# Patient Record
Sex: Male | Born: 1950 | Race: White | Hispanic: No | Marital: Married
Health system: Southern US, Community
[De-identification: ages and names within clinical notes are randomized; demographics above are authoritative.]

---

## 2016-11-07 ENCOUNTER — Other Ambulatory Visit: Payer: Self-pay | Admitting: Emergency Medicine

## 2016-11-07 ENCOUNTER — Ambulatory Visit (INDEPENDENT_AMBULATORY_CARE_PROVIDER_SITE_OTHER): Payer: Worker's Compensation | Admitting: Sports Medicine

## 2016-11-07 ENCOUNTER — Ambulatory Visit (INDEPENDENT_AMBULATORY_CARE_PROVIDER_SITE_OTHER): Payer: Worker's Compensation

## 2016-11-07 ENCOUNTER — Ambulatory Visit (INDEPENDENT_AMBULATORY_CARE_PROVIDER_SITE_OTHER): Payer: Self-pay

## 2016-11-07 DIAGNOSIS — S62326A Displaced fracture of shaft of fifth metacarpal bone, right hand, initial encounter for closed fracture: Secondary | ICD-10-CM | POA: Diagnosis not present

## 2016-11-07 DIAGNOSIS — W230XXA Caught, crushed, jammed, or pinched between moving objects, initial encounter: Secondary | ICD-10-CM

## 2016-11-07 DIAGNOSIS — S62396A Other fracture of fifth metacarpal bone, right hand, initial encounter for closed fracture: Secondary | ICD-10-CM

## 2016-11-07 DIAGNOSIS — R52 Pain, unspecified: Secondary | ICD-10-CM

## 2016-11-07 DIAGNOSIS — W230XXD Caught, crushed, jammed, or pinched between moving objects, subsequent encounter: Secondary | ICD-10-CM | POA: Diagnosis not present

## 2016-11-07 DIAGNOSIS — S62396D Other fracture of fifth metacarpal bone, right hand, subsequent encounter for fracture with routine healing: Secondary | ICD-10-CM | POA: Diagnosis not present

## 2016-11-07 MED ORDER — HYDROCODONE-ACETAMINOPHEN 5-325 MG PO TABS: 1.0000 | ORAL_TABLET | Freq: Three times a day (TID) | ORAL | 0 refills | Status: DC | PRN

## 2016-11-07 NOTE — Assessment & Plan Note (Signed)
Fifth metacarpal fracture angulated at 55 post closed reduction now. Ulnar gutter splint placed, hydrocodone for pain. Patient will be sent downstairs for postreduction films, and return to see me in one week, x-ray before visit.  I billed a fracture code for this encounter, all subsequent visits will be post-op checks in the global period.

## 2016-11-07 NOTE — Progress Notes (Signed)
   Subjective:    I'm seeing this patient as a consultation for:  Dr. Lajean Manesavid Massey  CC: Right hand fracture  HPI: This is a pleasant 65 year old male, 4 days ago at work he had a door opened onto his hand, had immediate pain, swelling, bruising, deformity. He was seen by Dr. Georgina PillionMassey and referred to me for further evaluation and definitive treatment. Pain is localized without radiation, x-rays showed an angulated fifth metacarpal fracture.  Past medical history:  Negative.  See flowsheet/record as well for more information.  Surgical history: Negative.  See flowsheet/record as well for more information.  Family history: Negative.  See flowsheet/record as well for more information.  Social history: Negative.  See flowsheet/record as well for more information.  Allergies, and medications have been entered into the medical record, reviewed, and no changes needed.   Review of Systems: No headache, visual changes, nausea, vomiting, diarrhea, constipation, dizziness, abdominal pain, skin rash, fevers, chills, night sweats, weight loss, swollen lymph nodes, body aches, joint swelling, muscle aches, chest pain, shortness of breath, mood changes, visual or auditory hallucinations.   Objective:   General: Well Developed, well nourished, and in no acute distress.  Neuro/Psych: Alert and oriented x3, extra-ocular muscles intact, able to move all 4 extremities, sensation grossly intact. Skin: Warm and dry, no rashes noted.  Respiratory: Not using accessory muscles, speaking in full sentences, trachea midline.  Cardiovascular: Pulses palpable, no extremity edema. Abdomen: Does not appear distended. Right hand: Visibly swollen, deformity of the fifth metacarpal, minimal tenderness.  Initial x-rays personally reviewed and show a apex dorsal fracture of the neck of the fifth metacarpal angulated at 55.  Procedure:  Fracture Reduction   Risks, benefits, and alternatives explained and consent  obtained. Time out conducted. Surface prepped with alcohol. 5cc 3 mL lidocaine, 3 mL Marcaine lidocaine infiltrated in a hematoma block. Adequate anesthesia ensured. Fracture reduction: I applied and axial force on the fifth digit, and then applied a volarly directed force over the top of the fracture reducing it Post reduction films obtained showed anatomic/near-anatomic alignment. Pt stable, aftercare and follow-up advised.  Ulnar gutter splint applied.  Impression and Recommendations:   This case required medical decision making of moderate complexity.  Closed disp fracture of shaft of fifth metacarpal bone of right hand Fifth metacarpal fracture angulated at 55 post closed reduction now. Ulnar gutter splint placed, hydrocodone for pain. Patient will be sent downstairs for postreduction films, and return to see me in one week, x-ray before visit.  I billed a fracture code for this encounter, all subsequent visits will be post-op checks in the global period.

## 2016-11-11 ENCOUNTER — Ambulatory Visit (INDEPENDENT_AMBULATORY_CARE_PROVIDER_SITE_OTHER): Payer: Worker's Compensation

## 2016-11-11 ENCOUNTER — Encounter: Payer: Self-pay | Admitting: Sports Medicine

## 2016-11-11 ENCOUNTER — Ambulatory Visit (INDEPENDENT_AMBULATORY_CARE_PROVIDER_SITE_OTHER): Payer: Worker's Compensation | Admitting: Sports Medicine

## 2016-11-11 ENCOUNTER — Other Ambulatory Visit: Payer: Self-pay | Admitting: Sports Medicine

## 2016-11-11 VITALS — BP 157/92 | HR 81 | Resp 18 | Wt 171.0 lb

## 2016-11-11 DIAGNOSIS — S62396D Other fracture of fifth metacarpal bone, right hand, subsequent encounter for fracture with routine healing: Secondary | ICD-10-CM

## 2016-11-11 DIAGNOSIS — W230XXD Caught, crushed, jammed, or pinched between moving objects, subsequent encounter: Secondary | ICD-10-CM

## 2016-11-11 DIAGNOSIS — S62326D Displaced fracture of shaft of fifth metacarpal bone, right hand, subsequent encounter for fracture with routine healing: Secondary | ICD-10-CM

## 2016-11-11 DIAGNOSIS — S62326A Displaced fracture of shaft of fifth metacarpal bone, right hand, initial encounter for closed fracture: Secondary | ICD-10-CM

## 2016-11-11 MED ORDER — HYDROCODONE-ACETAMINOPHEN 10-325 MG PO TABS: 1.0000 | ORAL_TABLET | Freq: Three times a day (TID) | ORAL | 0 refills | Status: DC | PRN

## 2016-11-11 NOTE — Assessment & Plan Note (Signed)
Slight loss of reduction post closed reduction of a fifth metacarpal fracture. Still within tolerable limits. Increasing pain medication dose, continue splint for now. No changes in work restrictions. Return in 2 weeks.

## 2016-11-11 NOTE — Progress Notes (Signed)
  Subjective: 4 days post closed reduction of a right boxer's fracture. Overall doing okay, felt as though it was somewhat painful and a bit cold, but this all resolved by the time he came in.   Objective: General: Well-developed, well-nourished, and in no acute distress. Right hand: Swollen and bruised as expected, good capillary refill, neurovascularly intact distally. Only minimal tenderness at the fracture.  X-rays reviewed, there has been a slight loss of abduction but is still within acceptable limits. Angulation on the lateral view is approximately 30.  Assessment/plan:   Closed disp fracture of shaft of fifth metacarpal bone of right hand Slight loss of reduction post closed reduction of a fifth metacarpal fracture. Still within tolerable limits. Increasing pain medication dose, continue splint for now. No changes in work restrictions. Return in 2 weeks.

## 2016-11-14 ENCOUNTER — Ambulatory Visit: Payer: Self-pay | Admitting: Sports Medicine

## 2016-11-17 ENCOUNTER — Ambulatory Visit (INDEPENDENT_AMBULATORY_CARE_PROVIDER_SITE_OTHER): Payer: Self-pay | Admitting: Sports Medicine

## 2016-11-17 ENCOUNTER — Ambulatory Visit (INDEPENDENT_AMBULATORY_CARE_PROVIDER_SITE_OTHER): Payer: Worker's Compensation

## 2016-11-17 DIAGNOSIS — W230XXD Caught, crushed, jammed, or pinched between moving objects, subsequent encounter: Secondary | ICD-10-CM | POA: Diagnosis not present

## 2016-11-17 DIAGNOSIS — S62326D Displaced fracture of shaft of fifth metacarpal bone, right hand, subsequent encounter for fracture with routine healing: Secondary | ICD-10-CM

## 2016-11-17 DIAGNOSIS — S62396D Other fracture of fifth metacarpal bone, right hand, subsequent encounter for fracture with routine healing: Secondary | ICD-10-CM | POA: Diagnosis not present

## 2016-11-17 NOTE — Assessment & Plan Note (Signed)
Patient returned early, he has not been wearing his splint. Overall doing okay, repeat x-rays, needs to wear splints, return to see me in 2 weeks.

## 2016-11-17 NOTE — Progress Notes (Signed)
  Subjective: Albert Kim is just about one week post closed reduction of the fifth metacarpal boxer's fracture. He has not been wearing a splint, in fact he didn't even bring it today. He is on light duty with left-handed work only.  Objective: General: Well-developed, well-nourished, and in no acute distress. Right hand: Swollen, bruised as expected, minimally tender over the fracture, neurovascularly intact distally, no skin breakdown.  Assessment/plan:   Closed disp fracture of shaft of fifth metacarpal bone of right hand Patient returned early, he has not been wearing his splint. Overall doing okay, repeat x-rays, needs to wear splints, return to see me in 2 weeks.

## 2016-11-25 ENCOUNTER — Ambulatory Visit: Payer: Self-pay | Admitting: Sports Medicine

## 2016-12-01 ENCOUNTER — Ambulatory Visit (INDEPENDENT_AMBULATORY_CARE_PROVIDER_SITE_OTHER): Payer: Worker's Compensation | Admitting: Sports Medicine

## 2016-12-01 DIAGNOSIS — S62326D Displaced fracture of shaft of fifth metacarpal bone, right hand, subsequent encounter for fracture with routine healing: Secondary | ICD-10-CM

## 2016-12-01 NOTE — Progress Notes (Signed)
  Subjective: 3 weeks post fifth metacarpal boxer's fracture, doing well, pain-free.   Objective: General: Well-developed, well-nourished, and in no acute distress. Right hand: No longer tender to palpation over the fracture, good motion, good strength.  Assessment/plan:   Closed disp fracture of shaft of fifth metacarpal bone of right hand 3 weeks post fracture and doing well, may start light work with the right hand, no greater than 5 pounds lifting. Return to see me in 3 weeks, I will probably be clearing him after that.

## 2016-12-01 NOTE — Assessment & Plan Note (Signed)
3 weeks post fracture and doing well, may start light work with the right hand, no greater than 5 pounds lifting. Return to see me in 3 weeks, I will probably be clearing him after that.

## 2016-12-22 ENCOUNTER — Ambulatory Visit: Payer: Self-pay | Admitting: Sports Medicine

## 2016-12-23 ENCOUNTER — Ambulatory Visit (INDEPENDENT_AMBULATORY_CARE_PROVIDER_SITE_OTHER): Payer: Worker's Compensation | Admitting: Sports Medicine

## 2016-12-23 ENCOUNTER — Encounter: Payer: Self-pay | Admitting: Sports Medicine

## 2016-12-23 DIAGNOSIS — M19041 Primary osteoarthritis, right hand: Secondary | ICD-10-CM | POA: Diagnosis not present

## 2016-12-23 MED ORDER — MELOXICAM 15 MG PO TABS: ORAL_TABLET | ORAL | 3 refills | Status: AC

## 2016-12-23 NOTE — Assessment & Plan Note (Signed)
Pain at the second MCP as well as the trapeziometacarpal joint. These 2 structures were injected today, they are arthritic on ultrasound. Adding meloxicam. Return to see me in one month to evaluate response.

## 2016-12-23 NOTE — Progress Notes (Signed)
   Subjective:    I'm seeing this patient as a consultation for:  Albert SchillingElizabeth Duffy, NP  CC: right hand pain  HPI: I have been treating this pleasant 65 -year-oldmale for a boxer's fracture of the right hand, this is resolved, he is left with a bit of residual angulation but a functional hand, angulation is within tolerable limits. More recently he's been complaining of pain that he localizes at the second MCP as well as the trapeziometacarpal joint, moderate, persistent, doesn't respond to over-the-counter analgesics and NSAIDs. His main complaint is stiffness.  Past medical history:  Negative.  See flowsheet/record as well for more information.  Surgical history: Negative.  See flowsheet/record as well for more information.  Family history: Negative.  See flowsheet/record as well for more information.  Social history: Negative.  See flowsheet/record as well for more information.  Allergies, and medications have been entered into the medical record, reviewed, and no changes needed.   Review of Systems: No headache, visual changes, nausea, vomiting, diarrhea, constipation, dizziness, abdominal pain, skin rash, fevers, chills, night sweats, weight loss, swollen lymph nodes, body aches, joint swelling, muscle aches, chest pain, shortness of breath, mood changes, visual or auditory hallucinations.   Objective:   General: Well Developed, well nourished, and in no acute distress.  Neuro/Psych: Alert and oriented x3, extra-ocular muscles intact, able to move all 4 extremities, sensation grossly intact. Skin: Warm and dry, no rashes noted.  Respiratory: Not using accessory muscles, speaking in full sentences, trachea midline.  Cardiovascular: Pulses palpable, no extremity edema. Abdomen: Does not appear distended. Right hand: Tender to palpation with swelling at the second MCP as well as the trapeziometacarpal joint with a classic squared off appearance that is seen and osteoarthritis.  Procedure:  Real-time Ultrasound Guided Injection of right second MCP Device: GE Logiq E  Verbal informed consent obtained.  Time-out conducted.  Noted no overlying erythema, induration, or other signs of local infection.  Skin prepped in a sterile fashion.  Local anesthesia: Topical Ethyl chloride.  With sterile technique and under real time ultrasound guidance:  1/2 mL kenalog 40, 1/2 mL lidocaine injected easily Completed without difficulty  Pain immediately resolved suggesting accurate placement of the medication.  Advised to call if fevers/chills, erythema, induration, drainage, or persistent bleeding.  Images permanently stored and available for review in the ultrasound unit.  Impression: Technically successful ultrasound guided injection.  Procedure: Real-time Ultrasound Guided Injection of right trapeziometacarpal joint Device: GE Logiq E  Verbal informed consent obtained.  Time-out conducted.  Noted no overlying erythema, induration, or other signs of local infection.  Skin prepped in a sterile fashion.  Local anesthesia: Topical Ethyl chloride.  With sterile technique and under real time ultrasound guidance:  1/2 mL kenalog 40, 1/2 mL lidocaine injected easily Completed without difficulty  Pain immediately resolved suggesting accurate placement of the medication.  Advised to call if fevers/chills, erythema, induration, drainage, or persistent bleeding.  Images permanently stored and available for review in the ultrasound unit.  Impression: Technically successful ultrasound guided injection.  Impression and Recommendations:   This case required medical decision making of moderate complexity.  Primary osteoarthritis, right hand Pain at the second MCP as well as the trapeziometacarpal joint. These 2 structures were injected today, they are arthritic on ultrasound. Adding meloxicam. Return to see me in one month to evaluate response.

## 2017-01-23 ENCOUNTER — Ambulatory Visit: Payer: Self-pay | Admitting: Sports Medicine

## 2017-01-30 ENCOUNTER — Ambulatory Visit (INDEPENDENT_AMBULATORY_CARE_PROVIDER_SITE_OTHER): Payer: Worker's Compensation | Admitting: Sports Medicine

## 2017-01-30 DIAGNOSIS — M19041 Primary osteoarthritis, right hand: Secondary | ICD-10-CM

## 2017-01-30 NOTE — Progress Notes (Signed)
  Subjective:    CC: Follow-up  HPI: Right hand osteoarthritis: I performed a CMC as well as second MCP injection at the last visit, he returns pain-free in these 2 locations. He does complain of a bit of numbness and tingling in the first and second fingers that is fairly constant and he tells me he only felt that after his fracture. It really doesn't bother him that much, it doesn't affect his activities of daily living or his work, and he agrees not to have this worked up any further.  Past medical history:  Negative.  See flowsheet/record as well for more information.  Surgical history: Negative.  See flowsheet/record as well for more information.  Family history: Negative.  See flowsheet/record as well for more information.  Social history: Negative.  See flowsheet/record as well for more information.  Allergies, and medications have been entered into the medical record, reviewed, and no changes needed.   Review of Systems: No fevers, chills, night sweats, weight loss, chest pain, or shortness of breath.   Objective:    General: Well Developed, well nourished, and in no acute distress.  Neuro: Alert and oriented x3, extra-ocular muscles intact, sensation grossly intact.  HEENT: Normocephalic, atraumatic, pupils equal round reactive to light, neck supple, no masses, no lymphadenopathy, thyroid nonpalpable.  Skin: Warm and dry, no rashes. Cardiac: Regular rate and rhythm, no murmurs rubs or gallops, no lower extremity edema.  Respiratory: Clear to auscultation bilaterally. Not using accessory muscles, speaking in full sentences.  Impression and Recommendations:    Primary osteoarthritis, right hand Has done extremely well after right trapeziometacarpal and right second metacarpophalangeal joint injections about a month and a half ago. Return to see me as needed.  He does have a bit of tingling in the first and second fingers but this really doesn't bother him much and it doesn't  affect his work.

## 2017-01-30 NOTE — Assessment & Plan Note (Signed)
Has done extremely well after right trapeziometacarpal and right second metacarpophalangeal joint injections about a month and a half ago. Return to see me as needed.  He does have a bit of tingling in the first and second fingers but this really doesn't bother him much and it doesn't affect his work.

## 2017-11-11 IMAGING — DX DG HAND COMPLETE 3+V*R*
3 series · 3 of 3 positions shown · non-contrast
Comparison: 11/07/2016

CLINICAL DATA: Status post reduction

EXAM:
RIGHT HAND - COMPLETE 3+ VIEW

[hand pa]
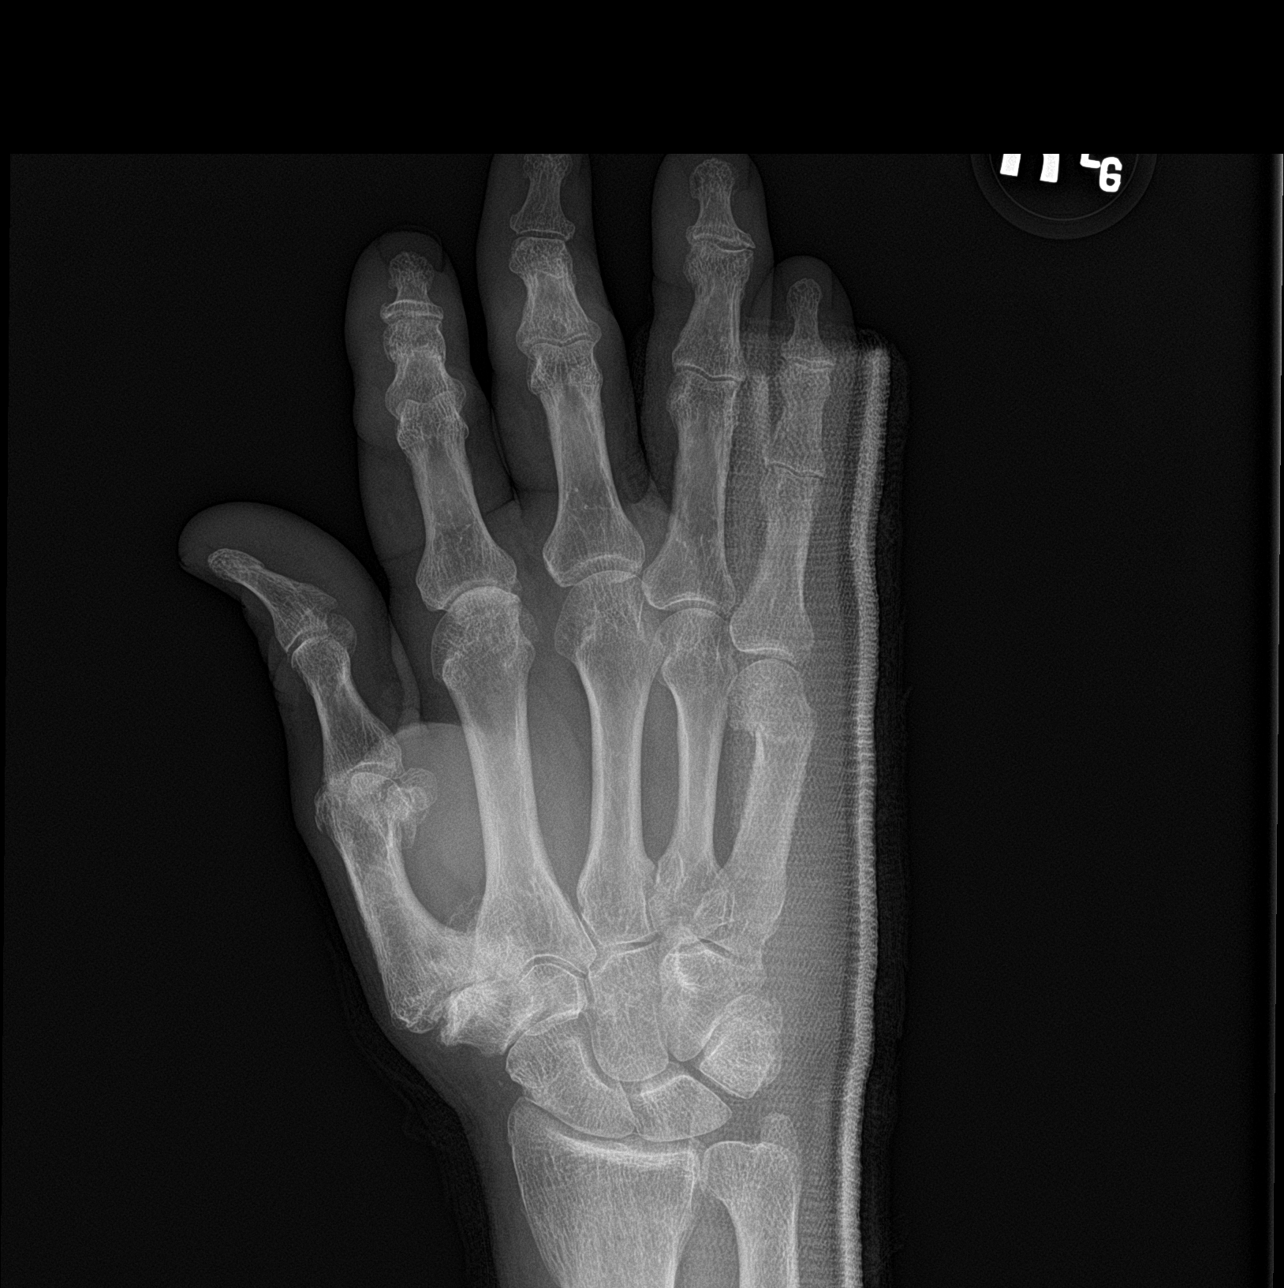

[hand obl]
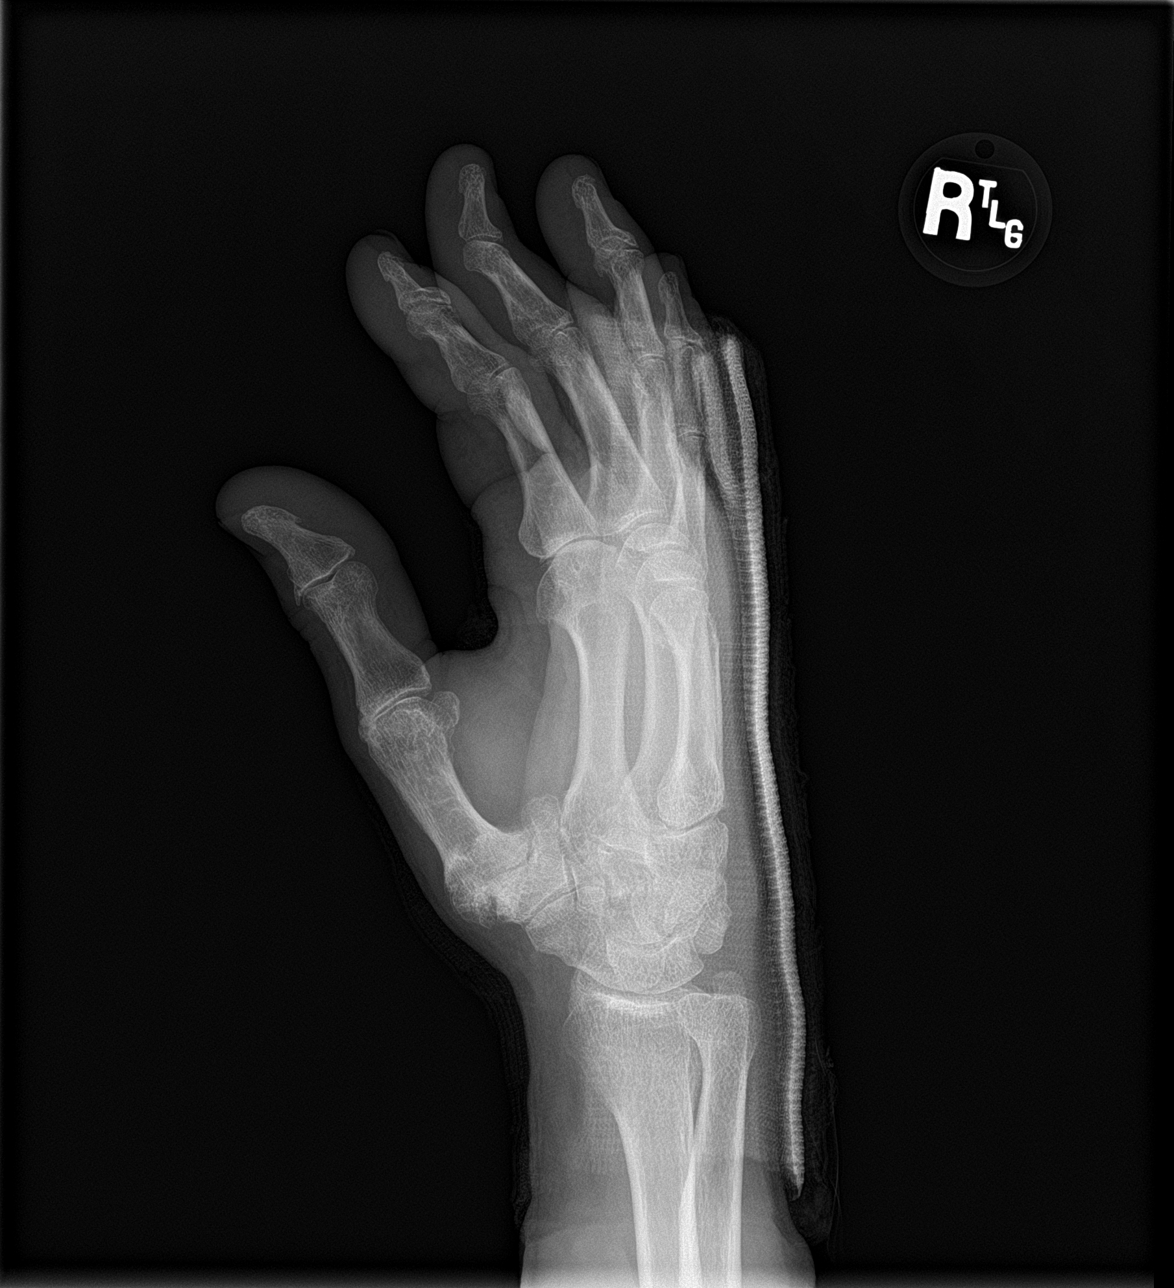

[hand lat]
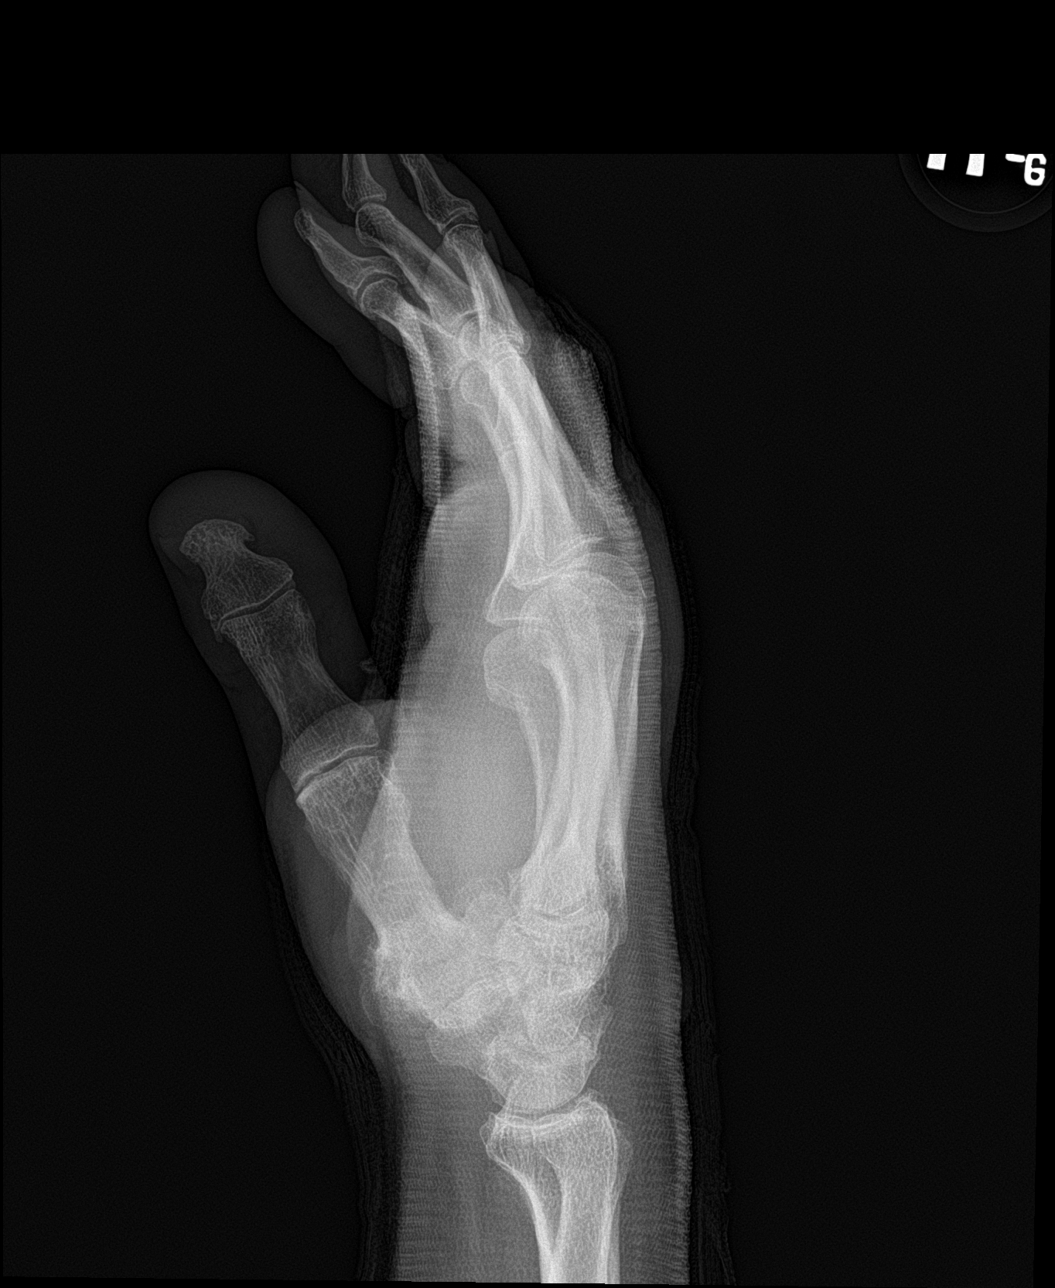

[3 of 3 positions shown; findings below may reference images not displayed]

FINDINGS: Previously seen fifth metacarpal fracture is again identified.
Slight angulation is again noted stable from the prior exam. No new
fracture or dislocation is seen.
IMPRESSION: Fifth metacarpal fracture with angulation relatively stable from the
prior study.

## 2017-11-15 IMAGING — DX DG HAND COMPLETE 3+V*R*
3 series · 3 of 3 positions shown · non-contrast
Comparison: Radiographs November 07, 2016.

CLINICAL DATA: Fifth metacarpal fracture.

EXAM:
RIGHT HAND - COMPLETE 3+ VIEW

[hand pa]
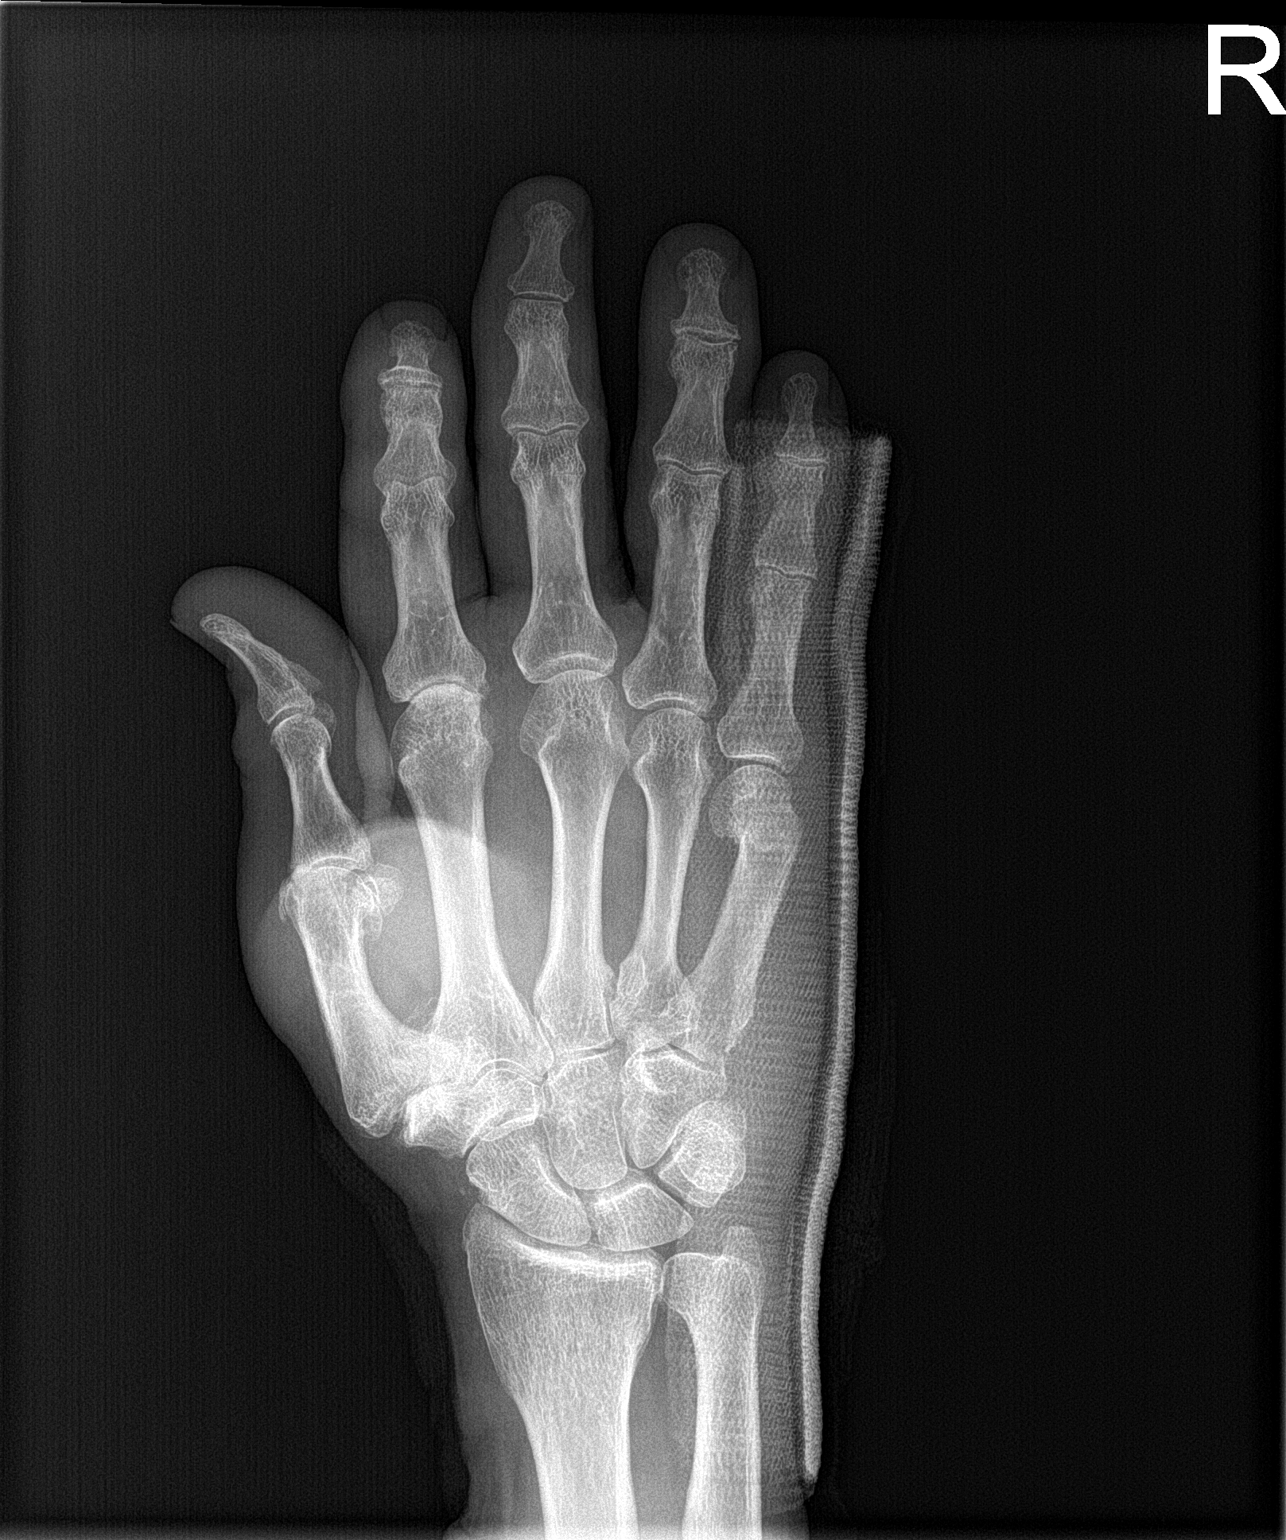

[hand obl]
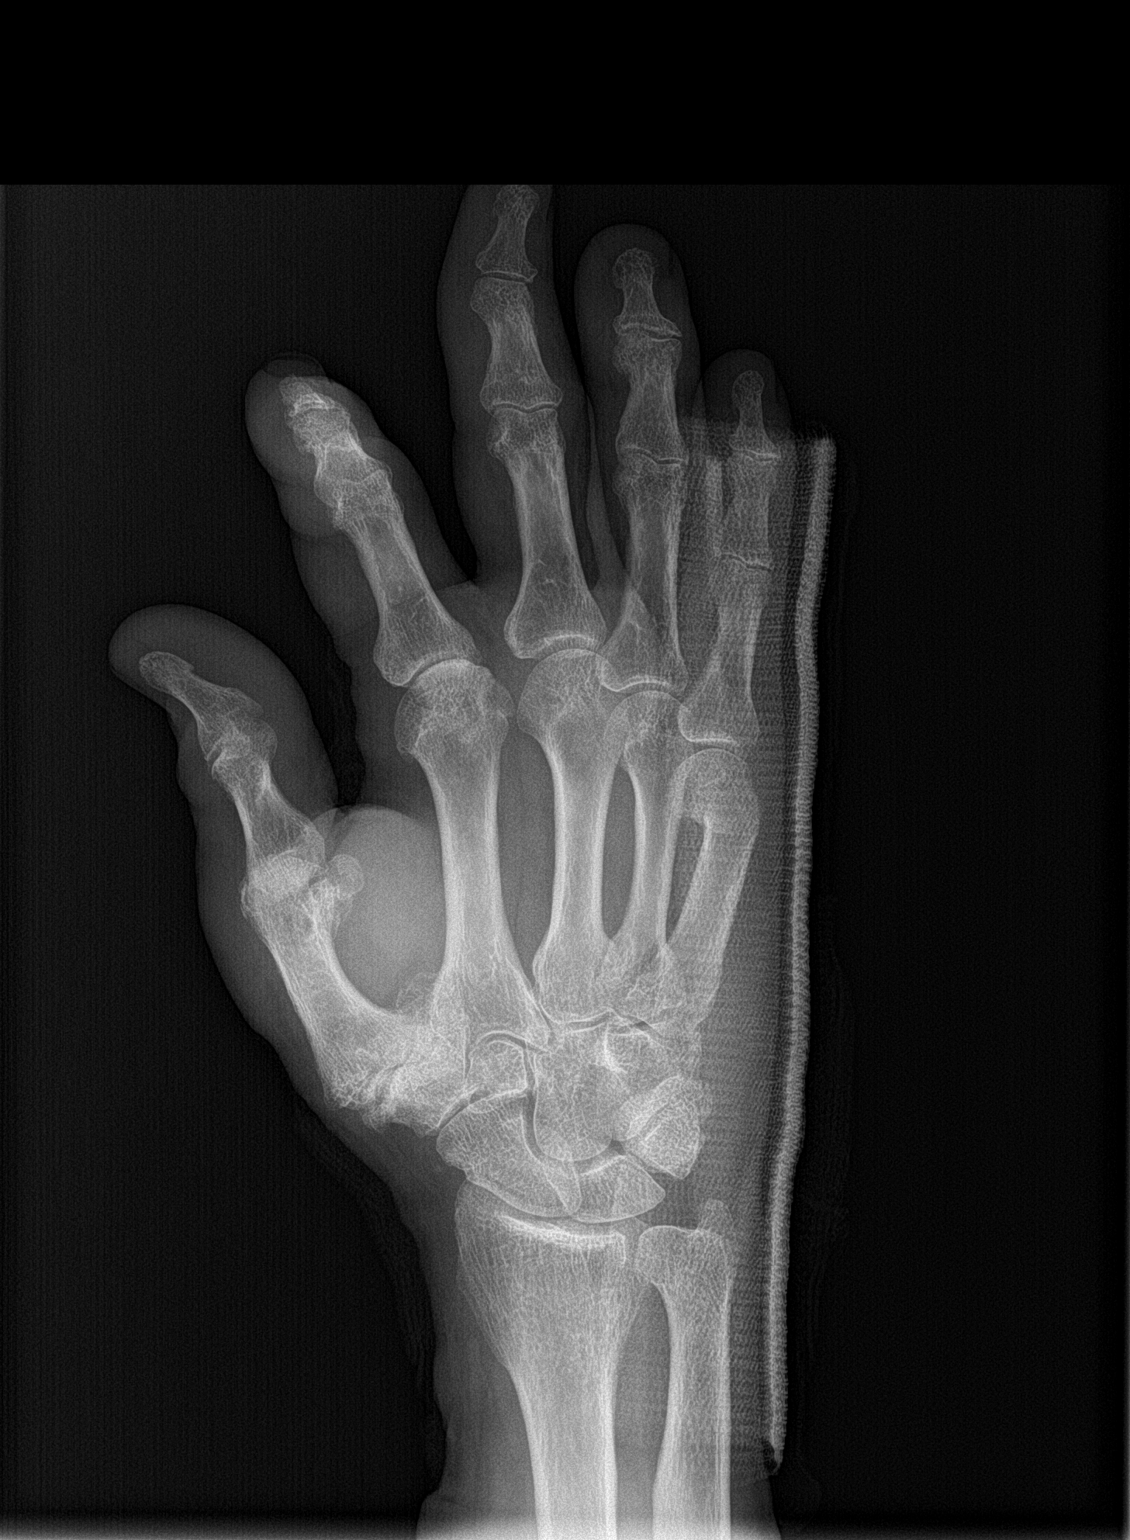

[hand lat]
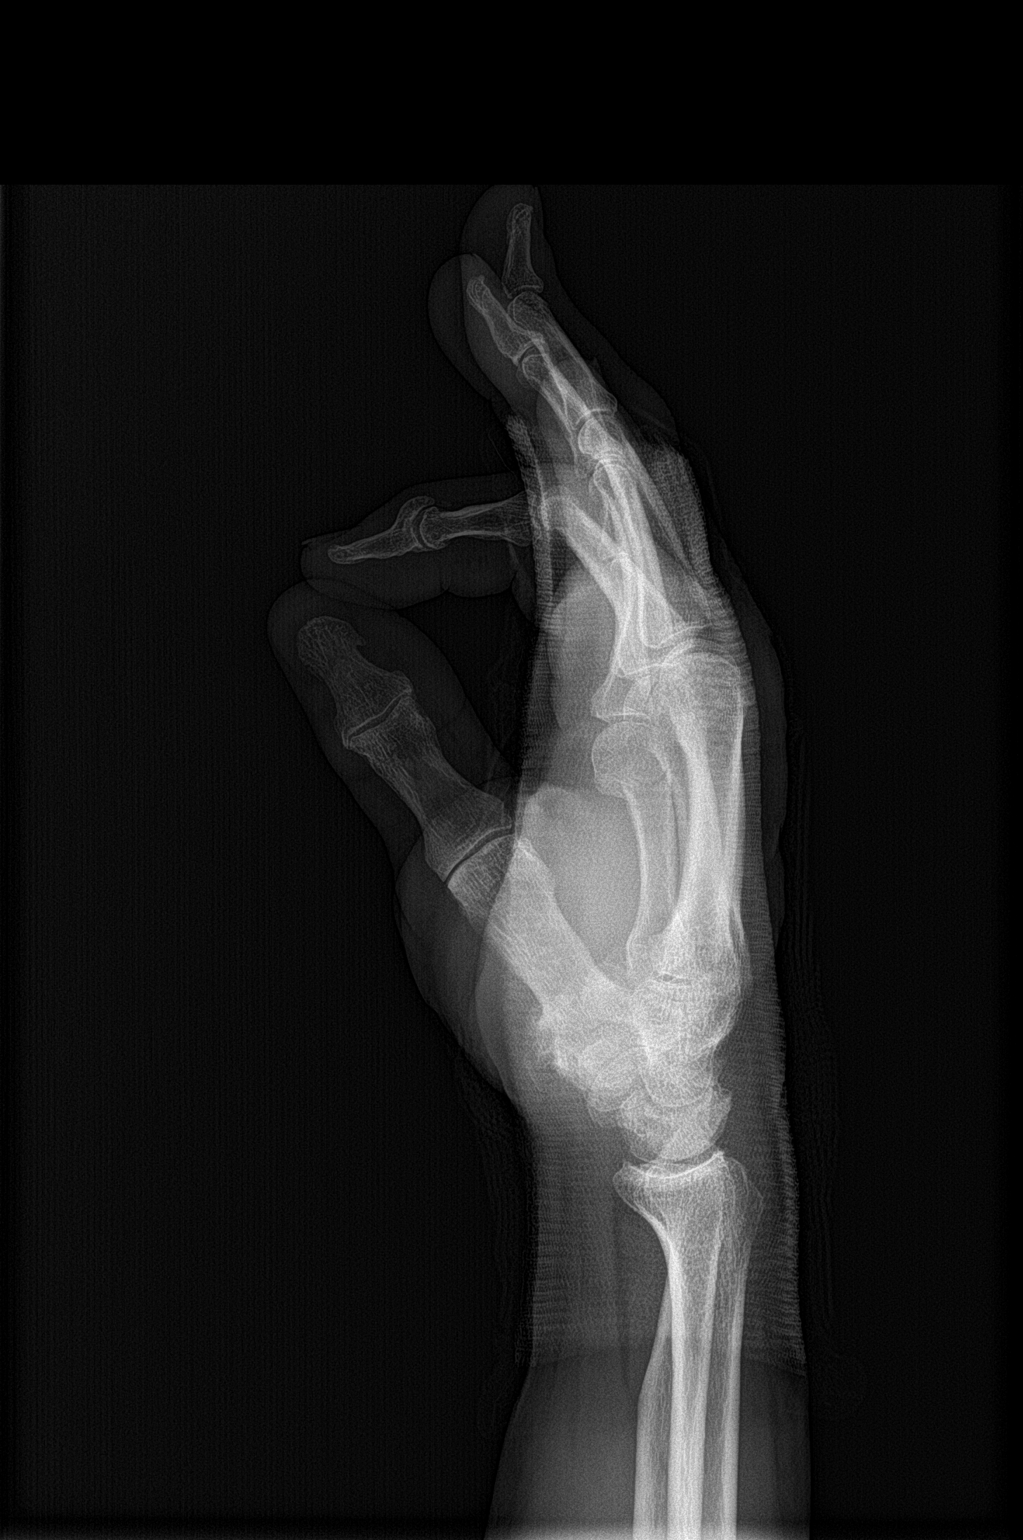

[3 of 3 positions shown; findings below may reference images not displayed]

FINDINGS: Stable appearance of mildly angulated fracture involving the distal
portion of fifth metacarpal. The hand has been splinted.
Osteoarthritis of the first carpometacarpal joint is again noted.
IMPRESSION: No significant change in appearance of mildly angulated distal fifth
metacarpal fracture.
